# Patient Record
Sex: Female | Born: 1995 | Race: White | Hispanic: No | Marital: Single | Smoking: Current every day smoker
Health system: Southern US, Community
[De-identification: ages and names within clinical notes are randomized; demographics above are authoritative.]

## PROBLEM LIST (undated history)

## (undated) HISTORY — PX: DILATION AND CURETTAGE OF UTERUS: SHX78

---

## 2014-06-10 ENCOUNTER — Observation Stay: Payer: Self-pay | Admitting: Obstetrics and Gynecology

## 2014-06-10 LAB — URINALYSIS, COMPLETE
Bacteria: NONE SEEN
Bilirubin,UR: NEGATIVE
Blood: NEGATIVE
Glucose,UR: NEGATIVE mg/dL (ref 0–75)
Ketone: NEGATIVE
LEUKOCYTE ESTERASE: NEGATIVE
Nitrite: NEGATIVE
PH: 6 (ref 4.5–8.0)
Specific Gravity: 1.023 (ref 1.003–1.030)
Squamous Epithelial: 4

## 2014-07-07 ENCOUNTER — Encounter: Payer: Self-pay | Admitting: Obstetrics and Gynecology

## 2014-07-13 ENCOUNTER — Observation Stay: Payer: Self-pay

## 2014-07-13 LAB — URINALYSIS, COMPLETE
BILIRUBIN, UR: NEGATIVE
Blood: NEGATIVE
Glucose,UR: NEGATIVE mg/dL (ref 0–75)
KETONE: NEGATIVE
LEUKOCYTE ESTERASE: NEGATIVE
Nitrite: NEGATIVE
PH: 6 (ref 4.5–8.0)
Protein: NEGATIVE
RBC,UR: 3 /HPF (ref 0–5)
Specific Gravity: 1.012 (ref 1.003–1.030)

## 2014-08-18 ENCOUNTER — Encounter: Payer: Self-pay | Admitting: Obstetrics & Gynecology

## 2014-10-21 ENCOUNTER — Inpatient Hospital Stay: Payer: Self-pay

## 2014-10-21 LAB — GC/CHLAMYDIA PROBE AMP

## 2014-10-21 LAB — CBC WITH DIFFERENTIAL/PLATELET
BASOS PCT: 0.3 %
Basophil #: 0 10*3/uL (ref 0.0–0.1)
EOS ABS: 0.1 10*3/uL (ref 0.0–0.7)
Eosinophil %: 0.5 %
HCT: 37.2 % (ref 35.0–47.0)
HGB: 12 g/dL (ref 12.0–16.0)
LYMPHS ABS: 3.1 10*3/uL (ref 1.0–3.6)
Lymphocyte %: 25.9 %
MCH: 28.1 pg (ref 26.0–34.0)
MCHC: 32.1 g/dL (ref 32.0–36.0)
MCV: 88 fL (ref 80–100)
MONO ABS: 0.7 x10 3/mm (ref 0.2–0.9)
Monocyte %: 5.6 %
NEUTROS ABS: 8.1 10*3/uL — AB (ref 1.4–6.5)
Neutrophil %: 67.7 %
PLATELETS: 238 10*3/uL (ref 150–440)
RBC: 4.25 10*6/uL (ref 3.80–5.20)
RDW: 14.9 % — AB (ref 11.5–14.5)
WBC: 12 10*3/uL — ABNORMAL HIGH (ref 3.6–11.0)

## 2014-10-23 LAB — HEMATOCRIT: HCT: 30.6 % — AB (ref 35.0–47.0)

## 2014-11-30 ENCOUNTER — Emergency Department: Payer: Self-pay | Admitting: Emergency Medicine

## 2014-11-30 LAB — CBC WITH DIFFERENTIAL/PLATELET
BASOS ABS: 0 10*3/uL (ref 0.0–0.1)
Basophil %: 0.3 %
Eosinophil #: 0 10*3/uL (ref 0.0–0.7)
Eosinophil %: 0.4 %
HCT: 38.8 % (ref 35.0–47.0)
HGB: 12.5 g/dL (ref 12.0–16.0)
LYMPHS ABS: 2.7 10*3/uL (ref 1.0–3.6)
LYMPHS PCT: 23.3 %
MCH: 28 pg (ref 26.0–34.0)
MCHC: 32.2 g/dL (ref 32.0–36.0)
MCV: 87 fL (ref 80–100)
MONO ABS: 0.5 x10 3/mm (ref 0.2–0.9)
Monocyte %: 4.7 %
Neutrophil #: 8.2 10*3/uL — ABNORMAL HIGH (ref 1.4–6.5)
Neutrophil %: 71.3 %
Platelet: 254 10*3/uL (ref 150–440)
RBC: 4.46 10*6/uL (ref 3.80–5.20)
RDW: 15.2 % — AB (ref 11.5–14.5)
WBC: 11.5 10*3/uL — ABNORMAL HIGH (ref 3.6–11.0)

## 2014-11-30 LAB — URINALYSIS, COMPLETE
Bacteria: NONE SEEN
Bilirubin,UR: NEGATIVE
Glucose,UR: NEGATIVE mg/dL (ref 0–75)
Hyaline Cast: 2
Ketone: NEGATIVE
NITRITE: NEGATIVE
PROTEIN: NEGATIVE
Ph: 6 (ref 4.5–8.0)
RBC,UR: 2 /HPF (ref 0–5)
SPECIFIC GRAVITY: 1.017 (ref 1.003–1.030)
Squamous Epithelial: 9
WBC UR: 9 /HPF (ref 0–5)

## 2014-11-30 LAB — COMPREHENSIVE METABOLIC PANEL
ALK PHOS: 116 U/L
ALT: 24 U/L
Albumin: 3.1 g/dL — ABNORMAL LOW (ref 3.8–5.6)
Anion Gap: 7 (ref 7–16)
BILIRUBIN TOTAL: 0.3 mg/dL (ref 0.2–1.0)
BUN: 6 mg/dL — ABNORMAL LOW (ref 7–18)
CREATININE: 0.93 mg/dL (ref 0.60–1.30)
Calcium, Total: 8.8 mg/dL — ABNORMAL LOW (ref 9.0–10.7)
Chloride: 106 mmol/L (ref 98–107)
Co2: 28 mmol/L (ref 21–32)
EGFR (African American): >60
EGFR (Non-African Amer.): >60
GLUCOSE: 108 mg/dL — AB (ref 65–99)
Osmolality: 279 (ref 275–301)
Potassium: 3.7 mmol/L (ref 3.5–5.1)
SGOT(AST): 25 U/L (ref 0–26)
SODIUM: 141 mmol/L (ref 136–145)
Total Protein: 7 g/dL (ref 6.4–8.6)

## 2015-04-04 NOTE — H&P (Signed)
L&D Evaluation:  History Expanded:  HPI 19 yo G1 who just moved to the area and has not established care yet with anyone. She is 22 weeks by her LMP and her stated dates. dhe has low back pain today and lower abd pain. Her urine is dark orange and she looks very dehydrated by the urine.denioes HA, RUQ pain.. or N/V. Vitals are stable no signs of anything else going on no contracitons on the moniter.We have no records from her last location ane we have asked for t hose to be transferred but no response today.   Gravida 1   Term 0   PreTerm 0   Abortion 0   Living 0   Group B Strep Results Maternal (Result >5wks must be treated as unknown) unknown/result > 5 weeks ago   Maternal HIV Unknown   Maternal Syphilis Ab Unknown   Maternal Varicella Unknown   Rubella Results (Maternal) unknown   Maternal T-Dap Unknown   Presents with abdominal pain, back pain   Patient's Medical History No Chronic Illness   Patient's Surgical History none   Medications Pre Natal Vitamins   Allergies NKDA   Social History none   Family History Non-Contributory   ROS:  ROS All systems were reviewed.  HEENT, CNS, GI, GU, Respiratory, CV, Renal and Musculoskeletal systems were found to be normal.   Exam:  Vital Signs stable   Urine Protein not completed   General no apparent distress   Mental Status clear   Chest clear   Heart normal sinus rhythm   Abdomen gravid, non-tender   Estimated Fetal Weight Average for gestational age   Edema no edema   Pelvic no external lesions   Mebranes Intact   Ucx absent   Skin dry   Impression:  Impression dehydration   Plan:  Plan EFM/NST, fluids   Comments monitor for ctx and po fluid hydrate as well as send urine for analysis and culture. follow u[facility] and to us for this follow up   Electronic Signatures: Adria DevonKlett, Yesenia Locurto (MD)  (Signed 17-Jul-15 21:11)  Authored: L&D Evaluation   Last Updated: 17-Jul-15  21:11 by Adria DevonKlett, Kirin Brandenburger (MD)

## 2015-04-04 NOTE — H&P (Signed)
L&D Evaluation:  History:  HPI 19 [facility] with pelvic pressure.  Denies dysuria.  No contractions, no vaginal bleeding, +FM,  Patient has no noteable risk factors for preterm labor such as prior PTB, or cervical procedures other than prior D&C.  She also reports a ground level fall 4 days ago.  No abdominal trauma with that fall.  Patient is Rh positive  [facility] cistern[facility]a though by DP to likely represent variant of normal   Presents with Pelvic pressure   Patient's Medical History No Chronic Illness   Patient's Surgical History D&C  11/03/13   Medications Pre Natal Vitamins   Allergies Sulfa   Social History none   ROS:  ROS All systems were reviewed.  HEENT, CNS, GI, GU, Respiratory, CV, Renal and Musculoskeletal systems were found to be normal.   Exam:  Vital Signs stable   Urine Protein not completed, Negative UA   General no apparent distress   Mental Status clear   Chest no increased work of breathing   Abdomen gravid, non-tender   Estimated Fetal Weight Average for gestational age   Fetal Position vtx   Back no CVAT   Edema no edema   Pelvic no external lesions, cervix closed and thick   Mebranes Intact   FHT 140, moderate, positive accels, no decels   Ucx absent   Skin dry, no lesions   Impression:  Impression 27 weeks 0 days with pelvic pressure   Plan:  Comments 1) Pelvic pressure - no ctx on toco, cervix closed, negative UA.  Reassurance routine PTL precautions  2) Fetus - category I tracing, reactive  3) PNL A positive / ABSC neg / RI / VZI / HBsAg neg / RPR NR / HIV negative  4) Disposition - follow up 07/20/14 for 28 weeks visit/labs   Follow Up Appointment already scheduled   Electronic Signatures: Lorrene ReidStaebler, Zlatan Hornback M (MD)  (Signed 19-Aug-15 22:30)  Authored: L&D Evaluation   Last Updated: 19-Aug-15 22:30 by Lorrene ReidStaebler, Dock Baccam M (MD)

## 2015-04-04 NOTE — H&P (Signed)
L&D Evaluation:  History:  HPI 18yo G2P0010 at 41w 2 days by LMP c/w 6w us with EDC of 10/12/14.  There have been no complicating factors this pregnancy, other than a transfer of care at 24 weeks.  Allergy to sulfa drugs  A+/ RI/ HIV NR/ VI / GBS negative   Presents with other, for scheudled induction for past due date induction   Patient's Medical History ezcema   Patient's Surgical History none   Medications Pre Natal Vitamins  zofran   Allergies Sulfa   Social History none   Family History Non-Contributory   ROS:  ROS All systems were reviewed.  HEENT, CNS, GI, GU, Respiratory, CV, Renal and Musculoskeletal systems were found to be normal.   Exam:  Vital Signs stable   General no apparent distress   Mental Status clear   Chest clear   Heart normal sinus rhythm   Abdomen gravid, non-tender   Estimated Fetal Weight Average for gestational age   Fetal Position cephalic   Back no CVAT   Reflexes 2+   Clonus negative   Pelvic no external lesions, 4/90/-1   Mebranes Ruptured, AROM   Description green/meconium, light to moderate meconium   FHT normal rate with no decels, + accels   Fetal Heart Rate 135    Ucx absent   Skin dry   Electronic Signatures: Saraiyah Hemminger, Elenora Fenderhelsea C (MD)  (Signed 27-Nov-15 13:51)  Authored: L&D Evaluation   Last Updated: 27-Nov-15 13:51 by Philamena Kramar, Elenora Fenderhelsea C (MD)

## 2016-09-21 ENCOUNTER — Encounter: Payer: Self-pay | Admitting: Emergency Medicine

## 2016-09-21 ENCOUNTER — Emergency Department: Payer: Self-pay

## 2016-09-21 ENCOUNTER — Emergency Department
Admission: EM | Admit: 2016-09-21 | Discharge: 2016-09-21 | Disposition: A | Discharge status: Home / Self Care | Payer: Self-pay | Attending: Emergency Medicine | Admitting: Emergency Medicine

## 2016-09-21 DIAGNOSIS — F17200 Nicotine dependence, unspecified, uncomplicated: Secondary | ICD-10-CM | POA: Insufficient documentation

## 2016-09-21 DIAGNOSIS — R110 Nausea: Secondary | ICD-10-CM | POA: Insufficient documentation

## 2016-09-21 DIAGNOSIS — R071 Chest pain on breathing: Secondary | ICD-10-CM

## 2016-09-21 DIAGNOSIS — R1011 Right upper quadrant pain: Secondary | ICD-10-CM | POA: Insufficient documentation

## 2016-09-21 DIAGNOSIS — R0789 Other chest pain: Secondary | ICD-10-CM

## 2016-09-21 DIAGNOSIS — R109 Unspecified abdominal pain: Secondary | ICD-10-CM

## 2016-09-21 DIAGNOSIS — M940 Chondrocostal junction syndrome [Tietze]: Secondary | ICD-10-CM | POA: Insufficient documentation

## 2016-09-21 LAB — COMPREHENSIVE METABOLIC PANEL
ALT: 16 U/L (ref 14–54)
AST: 17 U/L (ref 15–41)
Albumin: 3.9 g/dL (ref 3.5–5.0)
Alkaline Phosphatase: 96 U/L (ref 38–126)
Anion gap: 6 (ref 5–15)
BILIRUBIN TOTAL: 0.1 mg/dL — AB (ref 0.3–1.2)
BUN: 12 mg/dL (ref 6–20)
CO2: 25 mmol/L (ref 22–32)
Calcium: 9.3 mg/dL (ref 8.9–10.3)
Chloride: 106 mmol/L (ref 101–111)
Creatinine, Ser: 0.76 mg/dL (ref 0.44–1.00)
GFR calc Af Amer: >60 mL/min (ref >60)
Glucose, Bld: 98 mg/dL (ref 65–99)
POTASSIUM: 4.2 mmol/L (ref 3.5–5.1)
Sodium: 137 mmol/L (ref 135–145)
TOTAL PROTEIN: 7.7 g/dL (ref 6.5–8.1)

## 2016-09-21 LAB — CBC WITH DIFFERENTIAL/PLATELET
BASOS ABS: 0 10*3/uL (ref 0–0.1)
Basophils Relative: 1 %
EOS PCT: 1 %
Eosinophils Absolute: 0.1 10*3/uL (ref 0–0.7)
HCT: 40.5 % (ref 35.0–47.0)
Hemoglobin: 13.9 g/dL (ref 12.0–16.0)
LYMPHS PCT: 34 %
Lymphs Abs: 3.1 10*3/uL (ref 1.0–3.6)
MCH: 29.8 pg (ref 26.0–34.0)
MCHC: 34.4 g/dL (ref 32.0–36.0)
MCV: 86.5 fL (ref 80.0–100.0)
Monocytes Absolute: 0.7 10*3/uL (ref 0.2–0.9)
Monocytes Relative: 8 %
NEUTROS ABS: 5.2 10*3/uL (ref 1.4–6.5)
Neutrophils Relative %: 56 %
PLATELETS: 294 10*3/uL (ref 150–440)
RBC: 4.68 MIL/uL (ref 3.80–5.20)
RDW: 13.9 % (ref 11.5–14.5)
WBC: 9.2 10*3/uL (ref 3.6–11.0)

## 2016-09-21 LAB — URINALYSIS COMPLETE WITH MICROSCOPIC (ARMC ONLY)
Bacteria, UA: NONE SEEN
Bilirubin Urine: NEGATIVE
Glucose, UA: NEGATIVE mg/dL
HGB URINE DIPSTICK: NEGATIVE
KETONES UR: NEGATIVE mg/dL
LEUKOCYTES UA: NEGATIVE
NITRITE: NEGATIVE
PH: 6 (ref 5.0–8.0)
PROTEIN: NEGATIVE mg/dL
SPECIFIC GRAVITY, URINE: 1.012 (ref 1.005–1.030)

## 2016-09-21 LAB — POCT PREGNANCY, URINE
Preg Test, Ur: NEGATIVE
Preg Test, Ur: NEGATIVE

## 2016-09-21 MED ORDER — NAPROXEN 500 MG PO TABS: 500.0000 mg | ORAL_TABLET | Freq: Two times a day (BID) | ORAL | 0 refills | Status: DC

## 2016-09-21 MED ORDER — TRAMADOL HCL 50 MG PO TABS: 50.0000 mg | ORAL_TABLET | Freq: Four times a day (QID) | ORAL | 0 refills | Status: DC | PRN

## 2016-09-21 NOTE — ED Notes (Signed)
Pt verbalized understanding of discharge instructions. NAD at this time. 

## 2016-09-21 NOTE — ED Provider Notes (Signed)
[facility] Emergency Department Provider Note   ____________________________________________   First MD Initiated Contact with Patient 09/21/16 [phone]     (approximate)  I have reviewed the triage vital signs and the nursing notes.   HISTORY  Chief Complaint RIB pain    HPI Ruth Barajas is a 20 y.o. female patient complaining of right lateral abdominal pain that radiates to her back. Patient states pain for 3-4 days. Patient stated pain is worse with movement either. Patient states nausea without vomiting. Patient denies any trauma to increased physical activity. Patient states pain also increases after meals. Patient states some transient relief with hot showers. Patient rates the pain as a 5/10. Patient described a pain as "achy".   History reviewed. No pertinent past medical history.  There are no active problems to display for this patient.   History reviewed. No pertinent surgical history.  Prior to Admission medications   Medication Sig Start Date End Date Taking? Authorizing Provider  naproxen (NAPROSYN) 500 MG tablet Take 1 tablet (500 mg total) by mouth 2 (two) times daily with a meal. 09/21/16   Joni Reiningonald K Smith, PA-C  traMADol (ULTRAM) 50 MG tablet Take 1 tablet (50 mg total) by mouth every 6 (six) hours as needed for moderate pain. 09/21/16   Joni Reiningonald K Smith, PA-C    Allergies Sulfa antibiotics  History reviewed. No pertinent family history.  Social History Social History  Substance Use Topics  . Smoking status: Current Every Day Smoker  . Smokeless tobacco: Never Used  . Alcohol use No    Review of Systems Constitutional: No fever/chills Eyes: No visual changes. ENT: No sore throat. Cardiovascular: Denies chest pain. Respiratory: Denies shortness of breath. Gastrointestinal:Right upper and lateral abdominal pain.  Nausea without vomiting..  No diarrhea.  No constipation. Genitourinary: Negative for dysuria. Musculoskeletal:  Negative for back pain. Skin: Negative for rash. Neurological: Negative for headaches, focal weakness or numbness.  ____________________________________________   PHYSICAL EXAM:  VITAL SIGNS: ED Triage Vitals  Enc Vitals Group     BP 09/21/16 0924 (!) 114/50     Pulse Rate 09/21/16 0924 76     Resp 09/21/16 0924 18     Temp 09/21/16 0924 98 F (36.7 C)     Temp Source 09/21/16 0924 Oral     SpO2 09/21/16 0924 98 %     Weight 09/21/16 0925 178 lb (80.7 kg)     Height 09/21/16 0925 5\' 6"  (1.676 m)     Head Circumference --      Peak Flow --      Pain Score 09/21/16 0925 5     Pain Loc --      Pain Edu? --      Excl. in GC? --     Constitutional: Alert and oriented. Well appearing and in no acute distress. Eyes: Conjunctivae are normal. PERRL. EOMI. Head: Atraumatic. Nose: No congestion/rhinnorhea. Mouth/Throat: Mucous membranes are moist.  Oropharynx non-erythematous. Neck: No stridor.  No cervical spine tenderness to palpation. Hematological/Lymphatic/Immunilogical: No cervical lymphadenopathy. Cardiovascular: Normal rate, regular rhythm. Grossly normal heart sounds.  Good peripheral circulation. Respiratory: Normal respiratory effort.  No retractions. Lungs CTAB. Gastrointestinal: No obvious distention. Patient has normoactive bowel sounds. Patient has moderate guarding palpation right upper quadrant. Musculoskeletal: No lower extremity tenderness nor edema.  No joint effusions. Neurologic:  Normal speech and language. No gross focal neurologic deficits are appreciated. No gait instability. Skin:  Skin is warm, dry and intact. No rash noted.  Psychiatric: Mood and affect are normal. Speech and behavior are normal.  ____________________________________________   LABS (all labs ordered are listed, but only abnormal results are displayed)  Labs Reviewed  COMPREHENSIVE METABOLIC PANEL - Abnormal; Notable for the following:       Result Value   Total Bilirubin 0.1 (*)     All other components within normal limits  URINALYSIS COMPLETEWITH MICROSCOPIC ([facility] ONLY) - Abnormal; Notable for the following:    Color, Urine STRAW (*)    APPearance CLEAR (*)    Squamous Epithelial / LPF 0-5 (*)    All other components within normal limits  CBC WITH DIFFERENTIAL/PLATELET  POC URINE PREG, ED  POCT PREGNANCY, URINE  POCT PREGNANCY, URINE   ____________________________________________  EKG   ____________________________________________  RADIOLOGY  No acute findings on ultrasound the right upper quadrant. ____________________________________________   PROCEDURES  Procedure(s) performed: None  Procedures  Critical Care performed: No  ____________________________________________   INITIAL IMPRESSION / ASSESSMENT AND PLAN / ED COURSE  Pertinent labs & imaging results that were available during my care of the patient were reviewed by me and considered in my medical decision making (see chart for details).  Costochondritis. Patient given discharge care instructions. Patient is a prescription for naproxen and tramadol. Patient given a work note for 2 days. Patient advised to follow-up with family doctor if condition persists.  Clinical Course  Discussed labs and ultrasound findings with patient   ____________________________________________   FINAL CLINICAL IMPRESSION(S) / ED DIAGNOSES  Final diagnoses:  Abdominal pain  Costochondral pain      NEW MEDICATIONS STARTED DURING THIS VISIT:  New Prescriptions   NAPROXEN (NAPROSYN) 500 MG TABLET    Take 1 tablet (500 mg total) by mouth 2 (two) times daily with a meal.   TRAMADOL (ULTRAM) 50 MG TABLET    Take 1 tablet (50 mg total) by mouth every 6 (six) hours as needed for moderate pain.     Note:  This document was prepared using Dragon voice recognition software and may include unintentional dictation errors.    Joni ReiningRonald K Smith, PA-C 09/21/16 1213    Nita Sicklearolina Veronese, MD 09/25/16  1351

## 2016-09-21 NOTE — ED Triage Notes (Signed)
Pt to ed with c/o right side rib pain x 3-4 days.  Denies injury.  States pain worse with movement.

## 2016-09-21 NOTE — ED Notes (Signed)
POC urine preg  neg

## 2016-09-21 NOTE — ED Notes (Signed)
See triage note  Developed pain to right lateral rib area about 3 days ago pain increases with movement and inspiration  States she had some relief of pain with hot shower. Some nausea no fever or trauma

## 2017-04-15 ENCOUNTER — Ambulatory Visit (INDEPENDENT_AMBULATORY_CARE_PROVIDER_SITE_OTHER): Payer: Medicaid Other | Admitting: Obstetrics & Gynecology

## 2017-04-15 ENCOUNTER — Encounter: Payer: Self-pay | Admitting: Obstetrics & Gynecology

## 2017-04-15 VITALS — BP 120/80 | Wt 202.0 lb

## 2017-04-15 DIAGNOSIS — Z3490 Encounter for supervision of normal pregnancy, unspecified, unspecified trimester: Secondary | ICD-10-CM

## 2017-04-15 DIAGNOSIS — Z3A08 8 weeks gestation of pregnancy: Secondary | ICD-10-CM

## 2017-04-15 NOTE — Progress Notes (Signed)
04/15/2017   Chief Complaint: Missed period  Transfer of Care Patient: no  History of Present Illness: Ruth Barajas is a 21 y.o. G3P0010 [redacted]w[redacted]d based on Patient's last menstrual period was 02/14/2017. with an Estimated Date of Delivery: 11/21/17, with the above CC. Preg complicated by has Encounter for supervision of low-risk pregnancy, antepartum on her problem list..  Her periods were: irregular periods from 28 to 35 days She was using no method when she conceived.  She has Positive signs or symptoms of nausea/vomiting of pregnancy. She has Negative signs or symptoms of miscarriage or preterm labor She identifies Negative Zika risk factors for her and her partner On any different medications around the time she conceived/early pregnancy: No  History of varicella: Yes   ROS: A 12-point review of systems was performed and negative, except as stated in the above HPI.  OBGYN History: As per HPI. OB History  Gravida Para Term Preterm AB Living  3       1    SAB TAB Ectopic Multiple Live Births  1            # Outcome Date GA Lbr Len/2nd Weight Sex Delivery Anes PTL Lv  3 Current           2 Gravida           1 SAB               Any issues with any prior pregnancies: no Any prior children are healthy, doing well, without any problems or issues: yes History of pap smears: No. Last pap smear normal. Abnormal: no  History of STIs: No   Past Medical History: History reviewed. No pertinent past medical history.  Past Surgical History: Past Surgical History:  Procedure Laterality Date  . DILATION AND CURETTAGE OF UTERUS      Family History:  Family History  Problem Relation Age of Onset  . Diabetes Father    She denies any female cancers, bleeding or blood clotting disorders.  She denies any history of mental retardation, birth defects or genetic disorders in her or the FOB's history  Social History:  Social History   Social History  . Marital status: Single    Spouse  name: N/A  . Number of children: N/A  . Years of education: N/A   Occupational History  . Not on file.   Social History Main Topics  . Smoking status: Current Every Day Smoker  . Smokeless tobacco: Never Used  . Alcohol use No  . Drug use: No  . Sexual activity: Yes    Birth control/ protection: None   Other Topics Concern  . Not on file   Social History Narrative  . No narrative on file   Any pets in the household: no  Allergy: Allergies  Allergen Reactions  . Sulfa Antibiotics Rash    Current Outpatient Medications:  Current Outpatient Prescriptions:  .  naproxen (NAPROSYN) 500 MG tablet, Take 1 tablet (500 mg total) by mouth 2 (two) times daily with a meal., Disp: 20 tablet, Rfl: 0 .  traMADol (ULTRAM) 50 MG tablet, Take 1 tablet (50 mg total) by mouth every 6 (six) hours as needed for moderate pain., Disp: 12 tablet, Rfl: 0   Physical Exam:   BP 120/80   Wt 202 lb (91.6 kg)   LMP 02/14/2017   BMI 32.60 kg/m  Body mass index is 32.6 kg/m. Fundal height: not applicable  Constitutional: Well nourished, well developed female in no  acute distress.  Neck:  Supple, normal appearance, and no thyromegaly  Cardiovascular: S1, S2 normal, no murmur, rub or gallop, regular rate and rhythm Respiratory:  Clear to auscultation bilateral. Normal respiratory effort Abdomen: positive bowel sounds and no masses, hernias; diffusely non tender to palpation, non distended Breasts: breasts appear normal, no suspicious masses, no skin or nipple changes or axillary nodes. Neuro/Psych:  Normal mood and affect.  Skin:  Warm and dry.  Lymphatic:  No inguinal lymphadenopathy.   Pelvic exam: is not limited by body habitus EGBUS: within normal limits, Vagina: within normal limits and with no blood in the vault, Cervix: normal appearing cervix without discharge or lesions, closed/long/high, Uterus:  nonenlarged, and Adnexa:  no mass, fullness, tenderness  Assessment: Ruth Barajas is a 21  y.o. G3P0010 7651w4d based on Patient's last menstrual period was 02/14/2017. with an Estimated Date of Delivery: 11/21/17,  for prenatal care.  Plan:  No problem-specific Assessment & Plan notes found for this encounter.  1. [redacted] weeks gestation of pregnancy - Urine culture - IGP,CtNgTv,rfx Aptima HPV ASCU - US OB Comp Less 14 Wks; Future  2. Encounter for supervision of low-risk pregnancy, antepartum - Urine culture - IGP,CtNgTv,rfx Aptima HPV ASCU   The patient has not traveled to a [place] endemic area within the past 6 months, nor has she had unprotected sex with a partner who has travelled to a [place] endemic region within the past 6 months. The patient has been advised to notify us if these factors change any time during this current pregnancy, so adequate testing and monitoring can be initiated.  Problem list reviewed and updated. Labs nv after US  Follow up in 1 weeks. Annamarie MajorPaul Harris, MD, Merlinda FrederickFACOG [facility], [facility] 04/15/2017  4:40 PM

## 2017-04-15 NOTE — Patient Instructions (Signed)

## 2017-04-18 LAB — URINE CULTURE

## 2017-04-19 LAB — IGP,CTNGTV,RFX APTIMA HPV ASCU
CHLAMYDIA, NUC. ACID AMP: POSITIVE — AB
GONOCOCCUS, NUC. ACID AMP: NEGATIVE
PAP Smear Comment: 0
TRICH VAG BY NAA: NEGATIVE

## 2017-04-23 ENCOUNTER — Ambulatory Visit (INDEPENDENT_AMBULATORY_CARE_PROVIDER_SITE_OTHER): Payer: Medicaid Other | Admitting: Obstetrics & Gynecology

## 2017-04-23 ENCOUNTER — Ambulatory Visit (INDEPENDENT_AMBULATORY_CARE_PROVIDER_SITE_OTHER): Payer: Medicaid Other

## 2017-04-23 VITALS — BP 120/80 | Wt 209.0 lb

## 2017-04-23 DIAGNOSIS — Z3490 Encounter for supervision of normal pregnancy, unspecified, unspecified trimester: Secondary | ICD-10-CM

## 2017-04-23 DIAGNOSIS — Z3687 Encounter for antenatal screening for uncertain dates: Secondary | ICD-10-CM | POA: Diagnosis not present

## 2017-04-23 DIAGNOSIS — Z3A08 8 weeks gestation of pregnancy: Secondary | ICD-10-CM

## 2017-04-23 DIAGNOSIS — Z3A09 9 weeks gestation of pregnancy: Secondary | ICD-10-CM

## 2017-04-23 DIAGNOSIS — A749 Chlamydial infection, unspecified: Secondary | ICD-10-CM

## 2017-04-23 MED ORDER — AZITHROMYCIN 500 MG PO TABS
1000.0000 mg | ORAL_TABLET | Freq: Once | ORAL | 0 refills | Status: AC
Start: 2017-04-23 — End: 2017-04-23

## 2017-04-23 NOTE — Progress Notes (Signed)
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 Tx today, also partner advised. TOC next visit. PNV. Labs today. Declines genetic testing.

## 2017-04-23 NOTE — Patient Instructions (Signed)
Azithromycin tablets What is this medicine? AZITHROMYCIN (az ith roe MYE sin) is a macrolide antibiotic. It is used to treat or prevent certain kinds of bacterial infections. It will not work for colds, flu, or other viral infections. This medicine may be used for other purposes; ask your health care provider or pharmacist if you have questions. COMMON BRAND NAME(S): Zithromax, Zithromax Tri-Pak, Zithromax Z-Pak What should I tell my health care provider before I take this medicine? They need to know if you have any of these conditions: -kidney disease -liver disease -irregular heartbeat or heart disease -an unusual or allergic reaction to azithromycin, erythromycin, other macrolide antibiotics, foods, dyes, or preservatives -pregnant or trying to get pregnant -breast-feeding How should I use this medicine? Take this medicine by mouth with a full glass of water. Follow the directions on the prescription label. The tablets can be taken with food or on an empty stomach. If the medicine upsets your stomach, take it with food. Take your medicine at regular intervals. Do not take your medicine more often than directed. Take all of your medicine as directed even if you think your are better. Do not skip doses or stop your medicine early. Talk to your pediatrician regarding the use of this medicine in children. While this drug may be prescribed for children as young as 6 months for selected conditions, precautions do apply. Overdosage: If you think you have taken too much of this medicine contact a poison control center or emergency room at once. NOTE: This medicine is only for you. Do not share this medicine with others. What if I miss a dose? If you miss a dose, take it as soon as you can. If it is almost time for your next dose, take only that dose. Do not take double or extra doses. What may interact with this medicine? Do not take this medicine with any of the following  medications: -lincomycin This medicine may also interact with the following medications: -amiodarone -antacids -birth control pills -cyclosporine -digoxin -magnesium -nelfinavir -phenytoin -warfarin This list may not describe all possible interactions. Give your health care provider a list of all the medicines, herbs, non-prescription drugs, or dietary supplements you use. Also tell them if you smoke, drink alcohol, or use illegal drugs. Some items may interact with your medicine. What should I watch for while using this medicine? Tell your doctor or healthcare professional if your symptoms do not start to get better or if they get worse. Do not treat diarrhea with over the counter products. Contact your doctor if you have diarrhea that lasts more than 2 days or if it is severe and watery. This medicine can make you more sensitive to the sun. Keep out of the sun. If you cannot avoid being in the sun, wear protective clothing and use sunscreen. Do not use sun lamps or tanning beds/booths. What side effects may I notice from receiving this medicine? Side effects that you should report to your doctor or health care professional as soon as possible: -allergic reactions like skin rash, itching or hives, swelling of the face, lips, or tongue -confusion, nightmares or hallucinations -dark urine -difficulty breathing -hearing loss -irregular heartbeat or chest pain -pain or difficulty passing urine -redness, blistering, peeling or loosening of the skin, including inside the mouth -white patches or sores in the mouth -yellowing of the eyes or skin Side effects that usually do not require medical attention (report to your doctor or health care professional if they continue or are bothersome): -  diarrhea -dizziness, drowsiness -headache -stomach upset or vomiting -tooth discoloration -vaginal irritation This list may not describe all possible side effects. Call your doctor for medical advice  about side effects. You may report side effects to FDA at 1-800-FDA-1088. Where should I keep my medicine? Keep out of the reach of children. Store at room temperature between 15 and 30 degrees C (59 and 86 degrees F). Throw away any unused medicine after the expiration date. NOTE: This sheet is a summary. It may not cover all possible information. If you have questions about this medicine, talk to your doctor, pharmacist, or health care provider.  2018 Elsevier/Gold Standard (2016-01-09 15:26:03)  

## 2017-04-24 LAB — RPR+RH+ABO+RUB AB+AB SCR+CB...
ANTIBODY SCREEN: NEGATIVE
HEMATOCRIT: 39.3 % (ref 34.0–46.6)
HEMOGLOBIN: 13.1 g/dL (ref 11.1–15.9)
HIV Screen 4th Generation wRfx: NONREACTIVE
Hepatitis B Surface Ag: NEGATIVE
MCH: 29.7 pg (ref 26.6–33.0)
MCHC: 33.3 g/dL (ref 31.5–35.7)
MCV: 89 fL (ref 79–97)
Platelets: 282 10*3/uL (ref 150–379)
RBC: 4.41 x10E6/uL (ref 3.77–5.28)
RDW: 14 % (ref 12.3–15.4)
RH TYPE: POSITIVE
RPR Ser Ql: NONREACTIVE
RUBELLA: 5.61 {index} (ref >0.99)
VARICELLA: 964 {index} (ref >165)
WBC: 9.6 10*3/uL (ref 3.4–10.8)

## 2018-02-13 ENCOUNTER — Encounter (HOSPITAL_COMMUNITY): Payer: Self-pay

## 2018-05-27 IMAGING — US US ABDOMEN LIMITED
1 series · 14 of 25 positions shown · non-contrast
Comparison: None.

CLINICAL DATA: Right upper abdominal pain

EXAM:
US ABDOMEN LIMITED - RIGHT UPPER QUADRANT

[Series 1: us abdomen limited · 0.17mm/px · 14 of 63 slices shown]
[im 1/63]
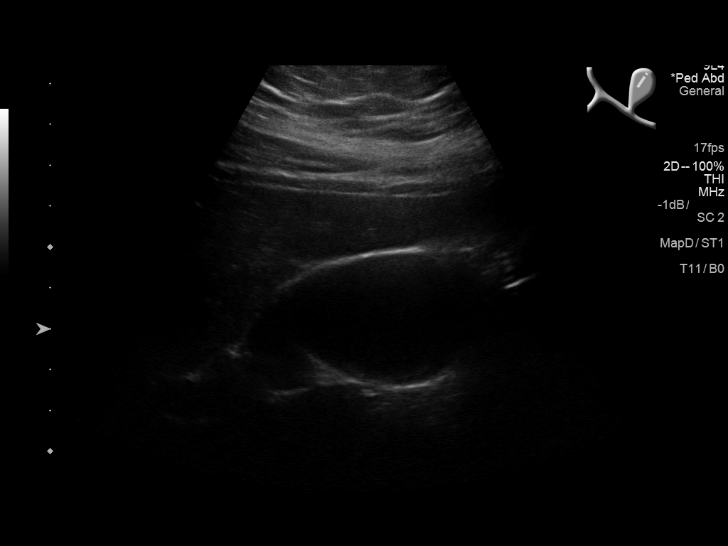
[im 6/63]
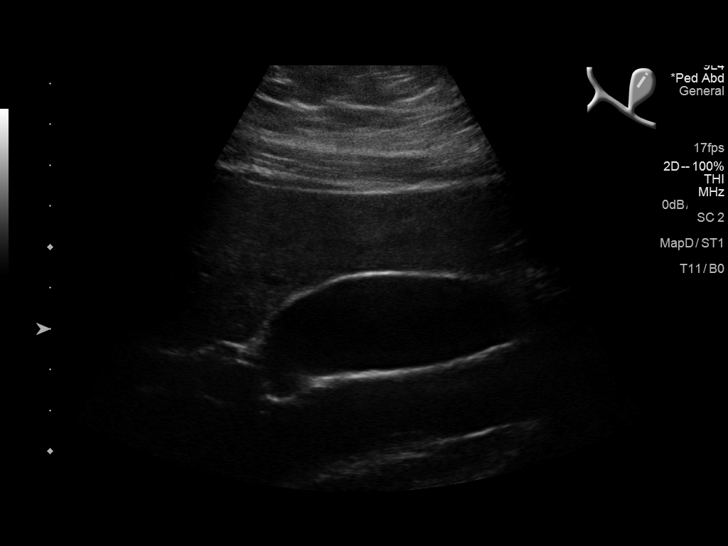
[im 11/63]
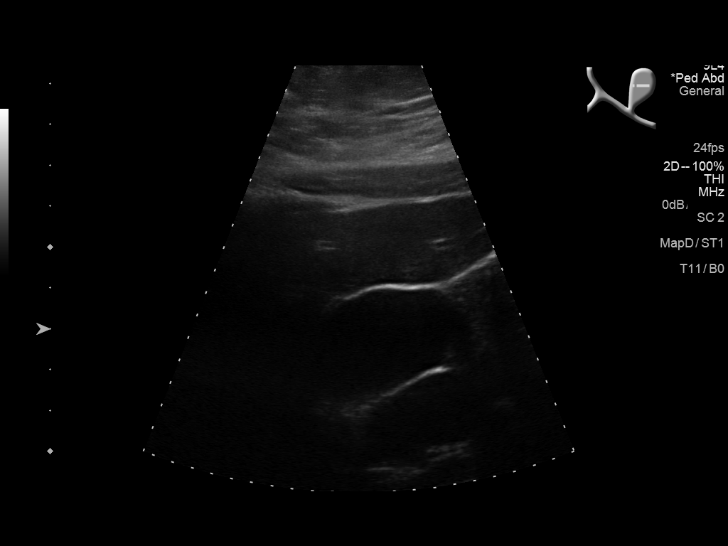
[im 16/63]
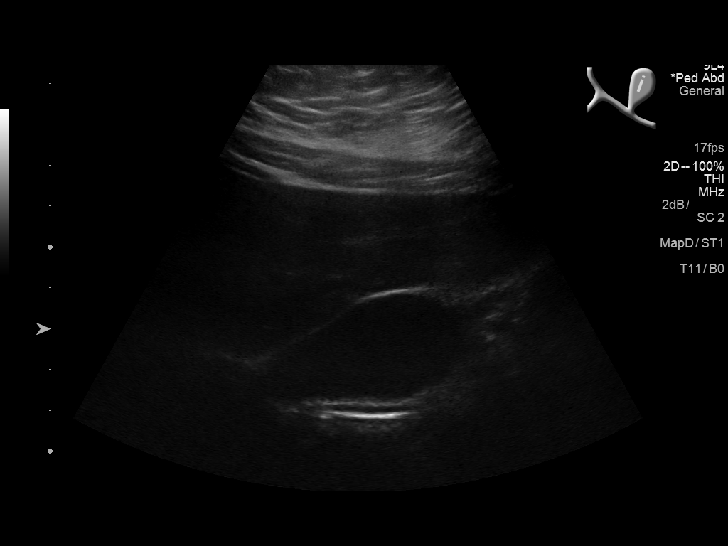
[im 21/63]
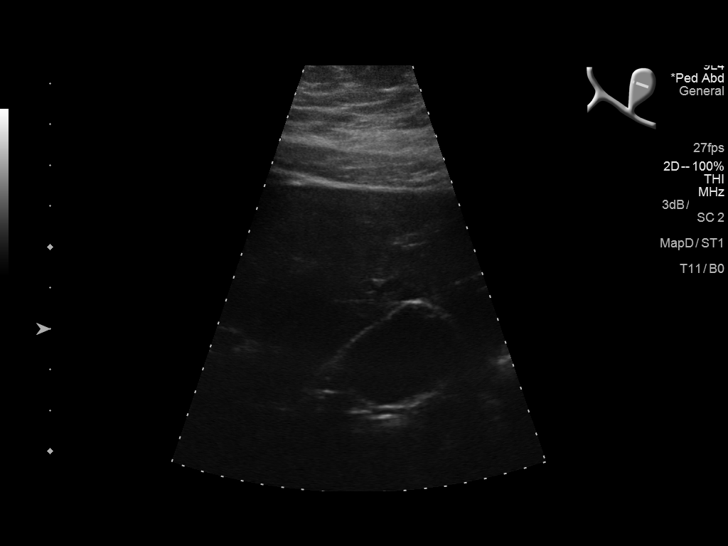
[im 24/63]
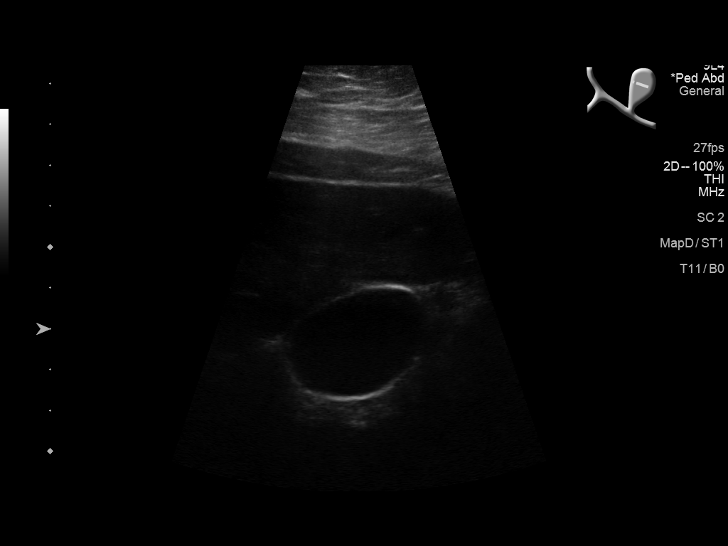
[im 29/63]
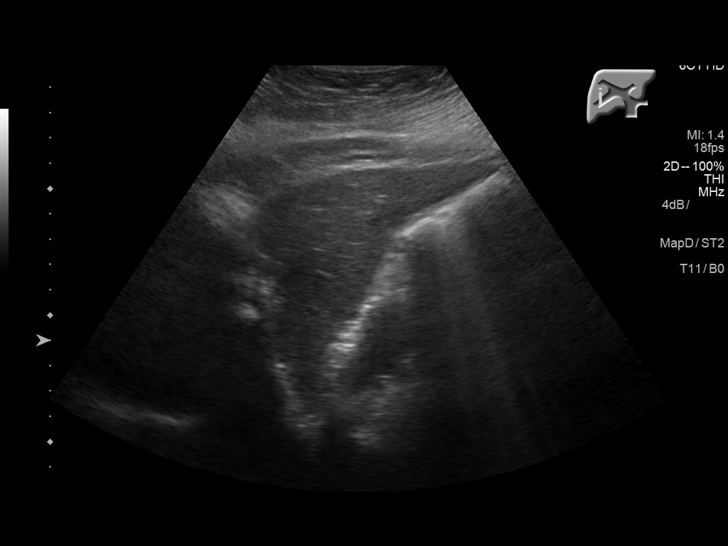
[im 34/63]
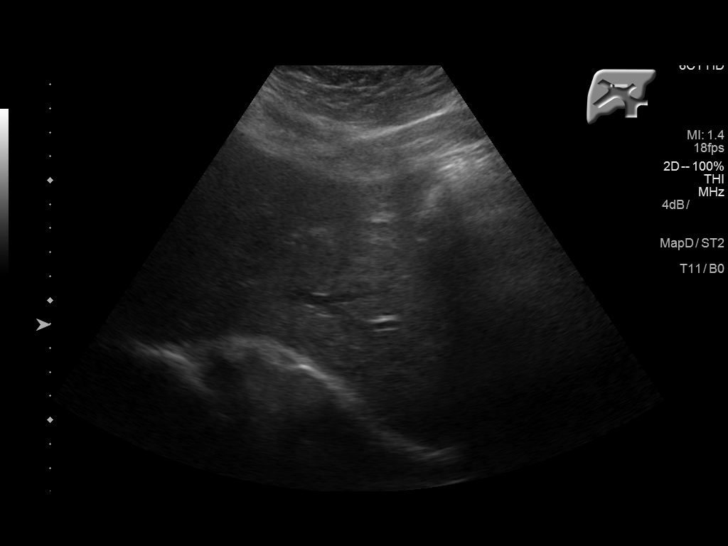
[im 39/63]
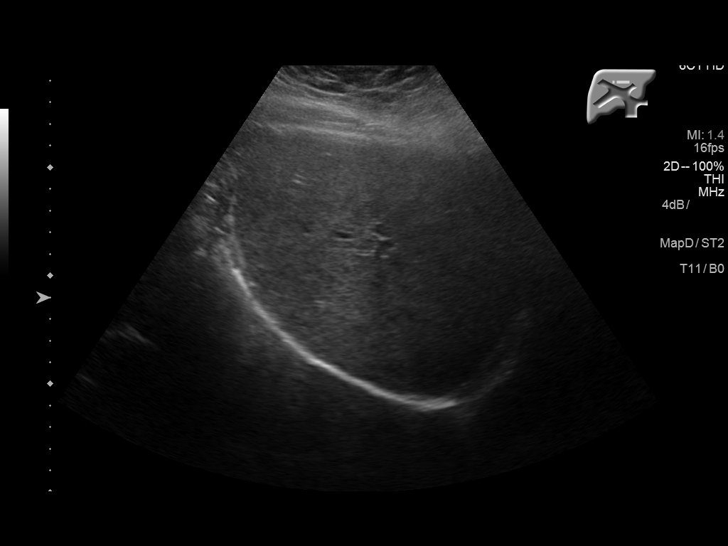
[im 42/63]
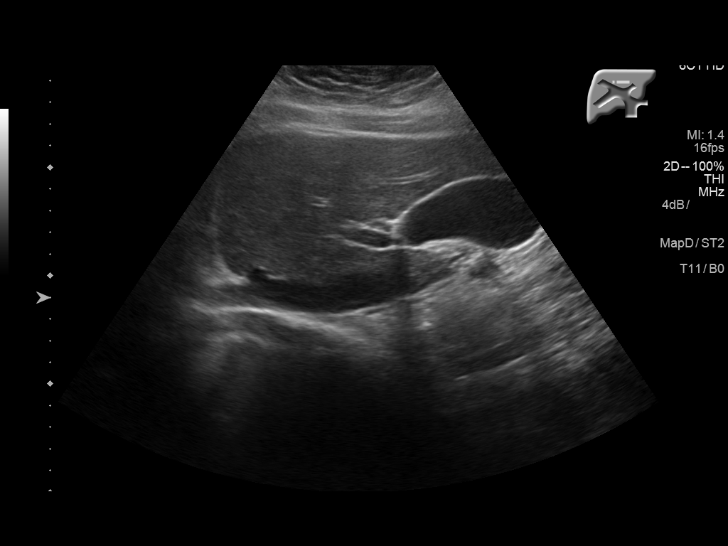
[im 47/63]
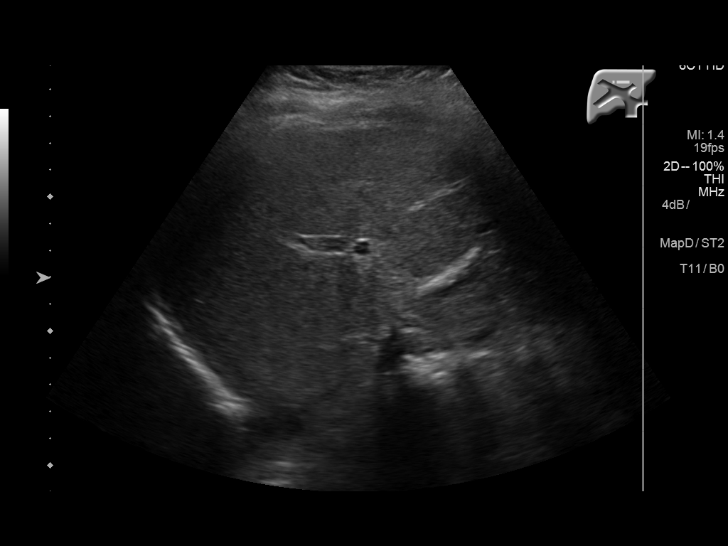
[im 52/63]
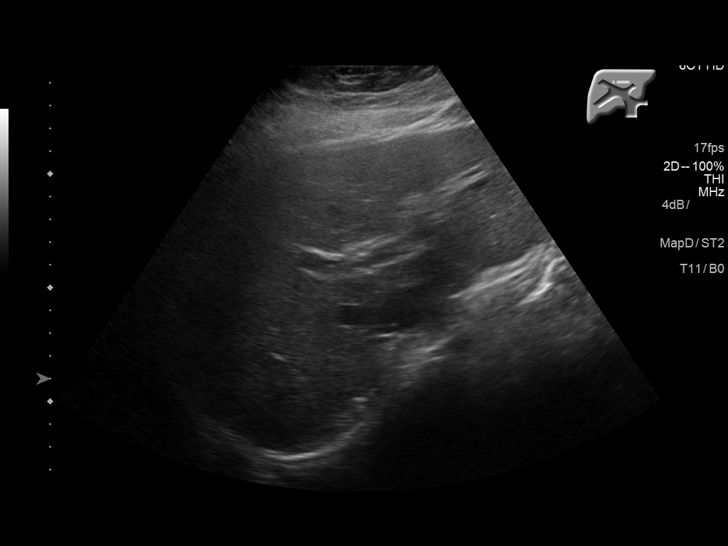
[im 57/63]
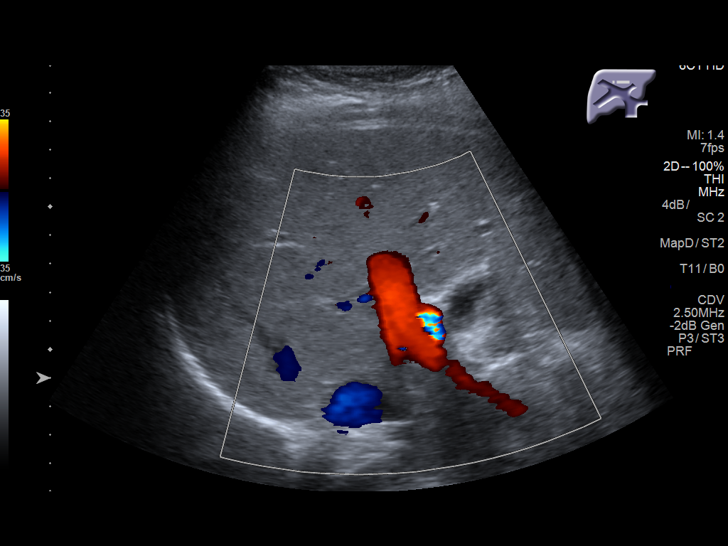
[im 63/63]
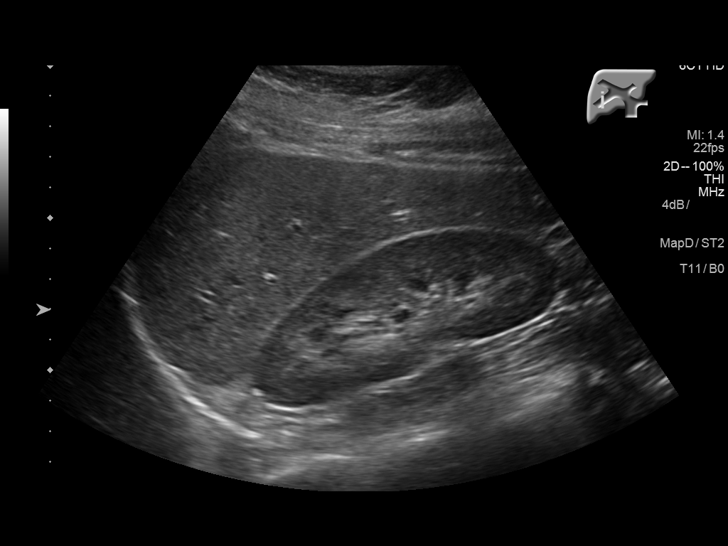

[14 of 25 positions shown; findings below may reference images not displayed]

FINDINGS: Gallbladder:

No gallstones or wall thickening visualized. No sonographic Murphy
sign noted by sonographer.

Common bile duct:

Diameter: 3 mm

Liver:

No focal lesion identified. Within normal limits in parenchymal
echogenicity.
IMPRESSION: 1. Normal right upper quadrant ultrasound.

## 2023-03-03 ENCOUNTER — Ambulatory Visit: Payer: Medicaid Other | Admitting: Family Medicine

## 2023-03-03 DIAGNOSIS — Z113 Encounter for screening for infections with a predominantly sexual mode of transmission: Secondary | ICD-10-CM | POA: Diagnosis not present

## 2023-03-03 LAB — WET PREP FOR TRICH, YEAST, CLUE
Trichomonas Exam: NEGATIVE
Yeast Exam: NEGATIVE

## 2023-03-03 LAB — HM HIV SCREENING LAB: HM HIV Screening: NEGATIVE

## 2023-03-03 NOTE — Progress Notes (Signed)
Pt is here for STD screening.  Pt states that she has a small amount of white/off white discharge and odor x 3 weeks.  She noticed the symptoms after having sex with a new partner.  Pt believes that she has BV.  I explained that we are unable to treat her for BV today, due to not having a Provider that can complete an exam.  Pt does not want to reschedule, stating "I had to take time off of work for this appointment."   Wet mount results reviewed, no treatment required per SO. Condoms and Kathreen Cosier card given to pt.  Pt was not seen by a Provider.  Berdie Ogren, RN

## 2023-03-18 ENCOUNTER — Telehealth: Payer: Medicaid Other | Admitting: Physician Assistant

## 2023-03-18 DIAGNOSIS — N760 Acute vaginitis: Secondary | ICD-10-CM

## 2023-03-18 DIAGNOSIS — B9689 Other specified bacterial agents as the cause of diseases classified elsewhere: Secondary | ICD-10-CM | POA: Diagnosis not present

## 2023-03-18 MED ORDER — METRONIDAZOLE 500 MG PO TABS
500.0000 mg | ORAL_TABLET | Freq: Two times a day (BID) | ORAL | 0 refills | Status: AC
Start: 2023-03-18 — End: ?

## 2023-03-18 NOTE — Progress Notes (Signed)
Virtual Visit Consent   Ruth Barajas, you are scheduled for a virtual visit with a [facility] provider today. Just as with appointments in the office, your consent must be obtained to participate. Your consent will be active for this visit and any virtual visit you may have with one of our providers in the next 365 days. If you have a MyChart account, a copy of this consent can be sent to you electronically.  As this is a virtual visit, video technology does not allow for your provider to perform a traditional examination. This may limit your provider's ability to fully assess your condition. If your provider identifies any concerns that need to be evaluated in person or the need to arrange testing (such as labs, EKG, etc.), we will make arrangements to do so. Although advances in technology are sophisticated, we cannot ensure that it will always work on either your end or our end. If the connection with a video visit is poor, the visit may have to be switched to a telephone visit. With either a video or telephone visit, we are not always able to ensure that we have a secure connection.  By engaging in this virtual visit, you consent to the provision of healthcare and authorize for your insurance to be billed (if applicable) for the services provided during this visit. Depending on your insurance coverage, you may receive a charge related to this service.  I need to obtain your verbal consent now. Are you willing to proceed with your visit today? Ruth Barajas has provided verbal consent on 03/18/2023 for a virtual visit (video or telephone). Piedad Climes, [place]  Date: 03/18/2023 9:56 AM  Virtual Visit via Video Note   I, Piedad Climes, connected with  Ruth Barajas  ([id number], 06-Sep-1996) on 03/18/23 at  9:45 AM EDT by a video-enabled telemedicine application and verified that I am speaking with the correct person using two identifiers.  Location: Patient: Virtual Visit Location  Patient: Home Provider: Virtual Visit Location Provider: Home Office   I discussed the limitations of evaluation and management by telemedicine and the availability of in person appointments. The patient expressed understanding and agreed to proceed.    History of Present Illness: Ruth Barajas is a 27 y.o. who identifies as a female who was assigned female at birth, and is being seen today for concern of BV.  Notes a few weeks of mild vaginal discharge and odor.  Recently was evaluated at the health [facility] with STI testing was negative.  Notes history of BV and this feels identical to previous episodes. LMP ended in the past couple of weeks.  Denies concern for pregnancy.  Denies any vaginal or abdominopelvic pain or discomfort.  Denies fever, chills, malaise or fatigue.   HPI: HPI  Problems:  Patient Active Problem List   Diagnosis Date Noted   Encounter for supervision of low-risk pregnancy, antepartum 04/15/2017    Allergies:  Allergies  Allergen Reactions   Sulfa Antibiotics Rash   Medications:  Current Outpatient Medications:    metroNIDAZOLE (FLAGYL) 500 MG tablet, Take 1 tablet (500 mg total) by mouth 2 (two) times daily., Disp: 14 tablet, Rfl: 0  Observations/Objective: Patient is well-developed, well-nourished in no acute distress.  Resting comfortably at home.  Head is normocephalic, atraumatic.  No labored breathing. Speech is clear and coherent with logical content.  Patient is alert and oriented at baseline.   Assessment and Plan: 1. BV (bacterial vaginosis) - metroNIDAZOLE (FLAGYL)  500 MG tablet; Take 1 tablet (500 mg total) by mouth 2 (two) times daily.  Dispense: 14 tablet; Refill: 0  Supportive measures and OTC medications reviewed.  Given classic symptoms and prior history, will treat for suspected BV with oral Flagyl.  In person follow-up for any nonresolving symptoms.  Follow Up Instructions: I discussed the assessment and treatment plan with the patient.  The patient was provided an opportunity to ask questions and all were answered. The patient agreed with the plan and demonstrated an understanding of the instructions.  A copy of instructions were sent to the patient via MyChart unless otherwise noted below.   The patient was advised to call back or seek an in-person evaluation if the symptoms worsen or if the condition fails to improve as anticipated.  Time:  I spent 10 minutes with the patient via telehealth technology discussing the above problems/concerns.    Piedad Climes, PA-C

## 2023-03-18 NOTE — Patient Instructions (Signed)
Ruth Barajas, thank you for joining Piedad Climes, PA-C for today's virtual visit.  While this provider is not your primary care provider (PCP), if your PCP is located in our provider database this encounter information will be shared with them immediately following your visit.   A [facility] MyChart account gives you access to today's visit and all your visits, tests, and labs performed at [facility] " click here if you don't have a [facility] MyChart account or go to [email or web address]  Consent: (Patient) Ruth Barajas provided verbal consent for this virtual visit at the beginning of the encounter.  Current Medications:  Current Outpatient Medications:    naproxen (NAPROSYN) 500 MG tablet, Take 1 tablet (500 mg total) by mouth 2 (two) times daily with a meal., Disp: 20 tablet, Rfl: 0   traMADol (ULTRAM) 50 MG tablet, Take 1 tablet (50 mg total) by mouth every 6 (six) hours as needed for moderate pain., Disp: 12 tablet, Rfl: 0   Medications ordered in this encounter:  No orders of the defined types were placed in this encounter.    *If you need refills on other medications prior to your next appointment, please contact your pharmacy*  Follow-Up: Call back or seek an in-person evaluation if the symptoms worsen or if the condition fails to improve as anticipated.  [facility] [phone]  Other Instructions Bacterial Vaginosis  Bacterial vaginosis is an infection of the vagina. It happens when too many normal germs (healthy bacteria) grow in the vagina. This infection can make it easier to get other infections from sex (STIs). It is very important for pregnant women to get treated. This infection can cause babies to be born early or at a low birth weight. What are the causes? This infection is caused by an increase in certain germs that grow in the vagina. You cannot get this infection from toilet seats, bedsheets, swimming pools, or  things that touch your vagina. What increases the risk? Having sex with a new person or more than one person. Having sex without protection. Douching. Having an intrauterine device (IUD). Smoking. Using drugs or drinking alcohol. These can lead you to do things that are risky. Taking certain antibiotic medicines. Being pregnant. What are the signs or symptoms? Some women have no symptoms. Symptoms may include: A discharge from your vagina. It may be gray or white. It can be watery or foamy. A fishy smell. This can happen after sex or during your menstrual period. Itching in and around your vagina. A feeling of burning or pain when you pee (urinate). How is this treated? This infection is treated with antibiotic medicines. These may be given to you as: A pill. A cream for your vagina. A medicine that you put into your vagina (suppository). If the infection comes back after treatment, you may need more antibiotics. Follow these instructions at home: Medicines Take over-the-counter and prescription medicines as told by your doctor. Take or use your antibiotic medicine as told by your doctor. Do not stop taking or using it, even if you start to feel better. General instructions If the person you have sex with is a woman, tell her that you have this infection. She will need to follow up with her doctor. If you have a female partner, he does not need to be treated. Do not have sex until you finish treatment. Drink enough fluid to keep your pee pale yellow. Keep your vagina and butt clean. Wash the area  with warm water each day. Wipe from front to back after you use the toilet. If you are breastfeeding a baby, ask your doctor if you should keep doing so during treatment. Keep all follow-up visits. How is this prevented? Self-care Do not douche. Use only warm water to wash around your vagina. Wear underwear that is cotton or lined with cotton. Do not wear tight pants and pantyhose,  especially in the summer. Safe sex Use protection when you have sex. This includes: Use condoms. Use dental dams. This is a thin layer that protects the mouth during oral sex. Limit how many people you have sex with. To prevent this infection, it is best to have sex with just one person. Get tested for STIs. The person you have sex with should also get tested. Drugs and alcohol Do not smoke or use any products that contain nicotine or tobacco. If you need help quitting, ask your doctor. Do not use drugs. Do not drink alcohol if: Your doctor tells you not to drink. You are pregnant, may be pregnant, or are planning to become pregnant. If you drink alcohol: Limit how much you have to 0-1 drink a day. Know how much alcohol is in your drink. In the [place] one drink equals one 12 oz bottle of beer (355 mL), one 5 oz glass of wine (148 mL), or one 1 oz glass of hard liquor (44 mL). Where to find more information Centers for Disease Control and Prevention: FootballExhibition.com.br American Sexual Health Association: www.ashastd.org Office on [employer] Health: http://hoffman.com/ Contact a doctor if: Your symptoms do not get better, even after you are treated. You have more discharge or pain when you pee. You have a fever or chills. You have pain in your belly (abdomen) or in the area between your hips. You have pain with sex. You bleed from your vagina between menstrual periods. Summary This infection can happen when too many germs (bacteria) grow in the vagina. This infection can make it easier to get infections from sex (STIs). Treating this can lower that chance. Get treated if you are pregnant. This infection can cause babies to be born early. Do not stop taking or using your antibiotic medicine, even if you start to feel better. This information is not intended to replace advice given to you by your health care provider. Make sure you discuss any questions you have with your health care  provider. Document Revised: 05/11/2020 Document Reviewed: 05/11/2020 Elsevier Patient Education  2023 Elsevier Inc.    If you have been instructed to have an in-person evaluation today at a local Urgent Care facility, please use the link below. It will take you to a list of all of our available [facility], including address, phone number and hours of operation. Please do not delay care.  [facility]  If you or a family member do not have a primary care provider, use the link below to schedule a visit and establish care. When you choose a [facility] primary care physician or advanced practice provider, you gain a long-term partner in health. Find a Primary Care Provider  Learn more about [facility] in-office and virtual care options: [facility] - Get Care Now
